# Patient Record
Sex: Male | Born: 2000 | Race: White | Hispanic: No | Marital: Single | Smoking: Never smoker
Health system: Southern US, Community
[De-identification: ages and names within clinical notes are randomized; demographics above are authoritative.]

## PROBLEM LIST (undated history)

## (undated) DIAGNOSIS — J45909 Unspecified asthma, uncomplicated: Secondary | ICD-10-CM

## (undated) HISTORY — PX: DENTAL SURGERY: SHX609

---
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
## 2020-11-09 ENCOUNTER — Telehealth (INDEPENDENT_AMBULATORY_CARE_PROVIDER_SITE_OTHER): Payer: Medicaid Other | Admitting: Nurse Practitioner

## 2020-11-09 ENCOUNTER — Encounter: Payer: Self-pay | Admitting: Nurse Practitioner

## 2020-11-09 DIAGNOSIS — R059 Cough, unspecified: Secondary | ICD-10-CM | POA: Insufficient documentation

## 2020-11-09 MED ORDER — DM-GUAIFENESIN ER 30-600 MG PO TB12: 1.0000 | ORAL_TABLET | Freq: Two times a day (BID) | ORAL | 0 refills | Status: AC

## 2020-11-09 NOTE — Assessment & Plan Note (Signed)
Patient is reporting signs and symptoms of worsening cough, dizziness, fatigue, body ache, headache and nausea in the last 24 hours.  Encourage patient to come by clinic for COVID-19 PCR swab results pending.  Increase hydration, Tylenol for headache.  Will treat patient pending lab results.  Patient verbalized understanding.

## 2020-11-09 NOTE — Addendum Note (Signed)
Addended by: Daryll Drown on: 11/09/2020 01:26 PM   Modules accepted: Orders

## 2020-11-09 NOTE — Progress Notes (Signed)
Virtual Visit via Video Note   This visit type was conducted due to national recommendations for restrictions regarding the COVID-19 Pandemic (e.g. social distancing) in an effort to limit this patient's exposure and mitigate transmission in our community.  Due to his co-morbid illnesses, this patient is at least at moderate risk for complications without adequate follow up.  This format is felt to be most appropriate for this patient at this time.  All issues noted in this document were discussed and addressed.  A limited physical exam was performed with this format.  A verbal consent was obtained for the virtual visit.   Date:  11/09/2020   ID:  Michael Mckay, DOB [birth year], MRN [id number]  Patient Location: Home Provider Location: Office/Clinic  PCP:  Raliegh Ip, DO   Evaluation Performed:  Acute visit  Chief Complaint: Cough History of Present Illness:    Michael Mckay is a 20 y.o. male with Cough: Patient complains of nonproductive cough.  Symptoms began 2 days ago.  The cough is non-productive, without wheezing, dyspnea or hemoptysis and is aggravated by nothing Associated symptoms include: Dizziness, body ache, nausea, fatigue, and headache . Patient does not have new pets. Patient does have a history of asthma. Patient does not have a history of environmental allergens. Patient has not recent travel. Patient does not have a history of smoking. Patient  does not have previous Chest X-ray. Patient has not had a PPD done.   The patient does have symptoms concerning for COVID-19 infection (fever, chills, cough, or new shortness of breath).    Past Medical History:  Diagnosis Date   Asthma     Past Surgical History:  Procedure Laterality Date   DENTAL SURGERY      Family History  Problem Relation Age of Onset   Hypertension Mother    Thyroid disease Mother    Diabetes Maternal Grandmother    Arthritis Maternal Grandmother    Hyperlipidemia Maternal Grandmother     Hypertension Maternal Grandmother    Heart disease Maternal Grandmother    Diabetes Maternal Grandfather    Heart disease Maternal Grandfather    COPD Maternal Grandfather    Hyperlipidemia Maternal Grandfather    Hypertension Maternal Grandfather     Social History   Socioeconomic History   Marital status: Single    Spouse name: Not on file   Number of children: Not on file   Years of education: Not on file   Highest education level: Not on file  Occupational History   Not on file  Tobacco Use   Smoking status: Passive Smoke Exposure - Never Smoker   Smokeless tobacco: Never  Substance and Sexual Activity   Alcohol use: No   Drug use: Not on file   Sexual activity: Not on file  Other Topics Concern   Not on file  Social History Narrative   Not on file   Social Determinants of Health   Financial Resource Strain: Not on file  Food Insecurity: Not on file  Transportation Needs: Not on file  Physical Activity: Not on file  Stress: Not on file  Social Connections: Not on file  Intimate Partner Violence: Not on file    Outpatient Medications Prior to Visit  Medication Sig Dispense Refill   cetirizine (ZYRTEC) 10 MG tablet Take 1 tablet (10 mg total) by mouth at bedtime. 30 tablet 11   ciprofloxacin-dexamethasone (CIPRODEX) OTIC suspension Place 4 drops into the left ear 2 (two) times daily. 7.5 mL  0   citalopram (CELEXA) 10 MG tablet Take 1 tablet (10 mg total) by mouth daily. 90 tablet 1   fluticasone (FLONASE) 50 MCG/ACT nasal spray Place 2 sprays into both nostrils daily. 16 g 6   No facility-administered medications prior to visit.    Allergies:   Amoxicillin   Social History   Tobacco Use   Smoking status: Passive Smoke Exposure - Never Smoker   Smokeless tobacco: Never  Substance Use Topics   Alcohol use: No     ROS   Labs/Other Tests and Data Reviewed:    Recent Labs: No results found for requested labs within last 8760 hours.   Recent Lipid  Panel Lab Results  Component Value Date/Time   CHOL 188 (H) 03/06/2018 04:17 PM   TRIG 146 (H) 03/06/2018 04:17 PM   HDL 48 03/06/2018 04:17 PM   CHOLHDL 3.9 03/06/2018 04:17 PM   LDLCALC 111 (H) 03/06/2018 04:17 PM    Wt Readings from Last 3 Encounters:  05/19/18 232 lb (105.2 kg) (99 %, Z= 2.32)*  03/31/18 226 lb (102.5 kg) (99 %, Z= 2.24)*  03/06/18 225 lb (102.1 kg) (99 %, Z= 2.24)*   * Growth percentiles are based on CDC (Boys, 2-20 Years) data.     Objective:    Vital Signs:  There were no vitals taken for this visit.   Physical Exam   ASSESSMENT & PLAN:   1. Cough - Novel Coronavirus, NAA ([facility])  Cough Patient is reporting signs and symptoms of worsening cough, dizziness, fatigue, body ache, headache and nausea in the last 24 hours.  Encourage patient to come by clinic for COVID-19 PCR swab results pending.  Increase hydration, Tylenol for headache.  Will treat patient pending lab results.  Patient verbalized understanding.   Orders Placed This Encounter  Procedures   Novel Coronavirus, NAA ([facility])     No orders of the defined types were placed in this encounter.   COVID-19 Education: The signs and symptoms of COVID-19 were discussed with the patient and how to seek care for testing (follow up with PCP or arrange E-visit). The importance of social distancing was discussed today.  Time:   Today, I have spent 9 minutes with the patient with telehealth technology discussing the above problems.    Follow Up:  Virtual Visit  prn  Signed, Daryll Drown, NP  11/09/2020 11:44 AM    [facility]

## 2020-11-10 LAB — NOVEL CORONAVIRUS, NAA: SARS-CoV-2, NAA: DETECTED — AB

## 2020-11-10 LAB — SARS-COV-2, NAA 2 DAY TAT

## 2021-06-14 DIAGNOSIS — H5213 Myopia, bilateral: Secondary | ICD-10-CM | POA: Diagnosis not present

## 2022-10-17 ENCOUNTER — Other Ambulatory Visit: Payer: Self-pay

## 2022-10-17 ENCOUNTER — Emergency Department (HOSPITAL_COMMUNITY): Payer: Self-pay

## 2022-10-17 ENCOUNTER — Encounter (HOSPITAL_COMMUNITY): Payer: Self-pay | Admitting: Emergency Medicine

## 2022-10-17 ENCOUNTER — Emergency Department (HOSPITAL_COMMUNITY)
Admission: EM | Admit: 2022-10-17 | Discharge: 2022-10-17 | Disposition: A | Discharge status: Home / Self Care | Payer: Self-pay | Attending: Emergency Medicine | Admitting: Emergency Medicine

## 2022-10-17 DIAGNOSIS — S93491A Sprain of other ligament of right ankle, initial encounter: Secondary | ICD-10-CM | POA: Insufficient documentation

## 2022-10-17 DIAGNOSIS — Y9353 Activity, golf: Secondary | ICD-10-CM | POA: Insufficient documentation

## 2022-10-17 DIAGNOSIS — S93401A Sprain of unspecified ligament of right ankle, initial encounter: Secondary | ICD-10-CM

## 2022-10-17 DIAGNOSIS — W102XXA Fall (on)(from) incline, initial encounter: Secondary | ICD-10-CM | POA: Insufficient documentation

## 2022-10-17 MED ORDER — IBUPROFEN 600 MG PO TABS: 600.0000 mg | ORAL_TABLET | Freq: Three times a day (TID) | ORAL | 0 refills | Status: AC

## 2022-10-17 MED ORDER — IBUPROFEN 800 MG PO TABS
800.0000 mg | ORAL_TABLET | Freq: Once | ORAL | Status: AC
  Administered 2022-10-17: 800 mg via ORAL
  Filled 2022-10-17: qty 1

## 2022-10-17 NOTE — Discharge Instructions (Addendum)
Wear the ASO and use crutches to avoid weight bearing.  Use ice and elevation as much as possible for the next several days to help reduce the swelling.  Call the orthopedic doctor listed for a recheck of your injury if not improving over the next 10 days with todays treatment plan.  You may benefit from physical therapy of your ankle if it is not getting better over the next week.

## 2022-10-17 NOTE — ED Provider Notes (Signed)
[place] EMERGENCY DEPARTMENT AT [facility] Provider Note   CSN: [id number] Arrival date & time: 10/17/22  1323     History  Chief Complaint  Patient presents with   Ankle Pain    Michael Mckay is a 22 y.o. male  presenting with right ankle pain which occurred suddenly when the patient slipped down a wet incline while golfing yesterday, inverting the ankle. .  Pain is aching, constant and worse with palpation, movement and weight bearing.  The patient was able to weight bear immediately after the event.  There is no radiation of pain and the patient denies numbness distal to the injury site.  The patients treatment prior to arrival included tylenol and rest.   The history is provided by the patient.       Home Medications Prior to Admission medications   Medication Sig Start Date End Date Taking? Authorizing Provider  ibuprofen (ADVIL) 600 MG tablet Take 1 tablet (600 mg total) by mouth 3 (three) times daily. 10/17/22  Yes Fortune Torosian, Raynelle Fanning, PA-C  cetirizine (ZYRTEC) 10 MG tablet Take 1 tablet (10 mg total) by mouth at bedtime. 07/11/18   Raliegh Ip, DO  ciprofloxacin-dexamethasone (CIPRODEX) OTIC suspension Place 4 drops into the left ear 2 (two) times daily. 11/12/18   Junie Spencer, FNP  citalopram (CELEXA) 10 MG tablet Take 1 tablet (10 mg total) by mouth daily. 08/04/18   Raliegh Ip, DO  dextromethorphan-guaiFENesin (MUCINEX DM) 30-600 MG 12hr tablet Take 1 tablet by mouth 2 (two) times daily. 11/09/20   Daryll Drown, NP  fluticasone (FLONASE) 50 MCG/ACT nasal spray Place 2 sprays into both nostrils daily. 07/11/18   Raliegh Ip, DO      Allergies    Amoxicillin    Review of Systems   Review of Systems  Musculoskeletal:  Positive for arthralgias and joint swelling.  Skin:  Negative for wound.  Neurological:  Negative for weakness and numbness.  All other systems reviewed and are negative.   Physical Exam Updated Vital Signs BP (!)  129/92   Pulse 77   Temp 98 F (36.7 C) (Oral)   Resp 18   Ht 6' (1.829 m)   Wt 104.3 kg   SpO2 100%   BMI 31.19 kg/m  Physical Exam Vitals and nursing note reviewed.  Constitutional:      Appearance: He is well-developed.  HENT:     Head: Normocephalic.  Cardiovascular:     Rate and Rhythm: Normal rate.     Pulses: Normal pulses. No decreased pulses.          Dorsalis pedis pulses are 2+ on the right side.       Posterior tibial pulses are 2+ on the right side.  Musculoskeletal:        General: Swelling and tenderness present. No deformity.     Right ankle: Swelling and ecchymosis present. Tenderness present over the lateral malleolus. No proximal fibula tenderness. Decreased range of motion. Normal pulse.     Right Achilles Tendon: Normal.  Skin:    General: Skin is warm and dry.     Findings: No lesion.  Neurological:     Mental Status: He is alert.     Sensory: No sensory deficit.     ED Results / Procedures / Treatments   Labs (all labs ordered are listed, but only abnormal results are displayed) Labs Reviewed - No data to display  EKG None  Radiology DG Ankle Complete  Right  Result Date: 10/17/2022 CLINICAL DATA:  Fall with right ankle pain.  Ankle rolled. EXAM: RIGHT ANKLE - COMPLETE 3+ VIEW COMPARISON:  Right foot radiographs 07/01/2013 FINDINGS: There is no evidence of fracture, dislocation, or joint effusion. There is no evidence of arthropathy or other focal bone abnormality. Lateral ankle soft swelling. IMPRESSION: Lateral ankle soft tissue swelling without acute fracture or dislocation. Electronically Signed   By: Emmaline Kluver M.D.   On: 10/17/2022 14:13    Procedures Procedures    Medications Ordered in ED Medications  ibuprofen (ADVIL) tablet 800 mg (has no administration in time range)    ED Course/ Medical Decision Making/ A&P                             Medical Decision Making Patient presenting with inversion injury to the right  ankle yesterday when he slipped down a wet incline.  Differential including fracture, subluxation, dislocation versus soft tissue injury.  Imaging reviewed and negative for fracture or dislocation.  He is being treated for an ankle sprain.  An ASO was provided.  He states he has crutches at home, we discussed home treatment including RICE, weightbearing as tolerated, ice.  Ibuprofen.  Referral to orthopedics for as needed follow-up care if symptoms persist or worsen beyond the next 10 days.  Amount and/or Complexity of Data Reviewed Radiology: ordered and independent interpretation performed.    Details: Reviewed and agree with interpretation, no fractures noted.           Final Clinical Impression(s) / ED Diagnoses Final diagnoses:  Sprain of right ankle, unspecified ligament, initial encounter    Rx / DC Orders ED Discharge Orders          Ordered    ibuprofen (ADVIL) 600 MG tablet  3 times daily        10/17/22 1443              Burgess Amor, Cordelia Poche 10/17/22 1445    Terrilee Files, MD 10/17/22 1754

## 2022-10-17 NOTE — ED Triage Notes (Signed)
Pt via POV c/o right ankle pain after he fell on a wet hill yesterday while playing golf. Pt states his ankle rolled underneath all his weight and he is now barely able to tolerate weight bearing. Ankle is in a support sleeve upon pt arrival and he reports pain rated 9/10 with weight bearing. He notes significant swelling and discoloration to lateral right ankle area.
